# Patient Record
Sex: Female | Born: 1970 | Race: White | Hispanic: No | Marital: Single | Smoking: Never smoker
Health system: Southern US, Community
[De-identification: ages and names within clinical notes are randomized; demographics above are authoritative.]

## PROBLEM LIST (undated history)

## (undated) DIAGNOSIS — R0602 Shortness of breath: Secondary | ICD-10-CM

## (undated) DIAGNOSIS — I10 Essential (primary) hypertension: Secondary | ICD-10-CM

---
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
## 2000-01-21 HISTORY — PX: AUGMENTATION MAMMAPLASTY: SUR837

## 2001-05-28 ENCOUNTER — Other Ambulatory Visit: Admission: RE | Admit: 2001-05-28 | Discharge: 2001-05-28 | Payer: Self-pay | Admitting: Obstetrics and Gynecology

## 2001-11-26 ENCOUNTER — Other Ambulatory Visit: Admission: RE | Admit: 2001-11-26 | Discharge: 2001-11-26 | Payer: Self-pay | Admitting: Obstetrics and Gynecology

## 2002-06-08 ENCOUNTER — Other Ambulatory Visit: Admission: RE | Admit: 2002-06-08 | Discharge: 2002-06-08 | Payer: Self-pay | Admitting: Obstetrics and Gynecology

## 2002-11-09 ENCOUNTER — Other Ambulatory Visit: Admission: RE | Admit: 2002-11-09 | Discharge: 2002-11-09 | Payer: Self-pay | Admitting: Obstetrics and Gynecology

## 2003-01-21 HISTORY — PX: TUBAL LIGATION: SHX77

## 2003-01-26 ENCOUNTER — Inpatient Hospital Stay (HOSPITAL_COMMUNITY): Admission: AD | Admit: 2003-01-26 | Discharge: 2003-01-27 | Payer: Self-pay | Admitting: Obstetrics and Gynecology

## 2003-03-06 ENCOUNTER — Inpatient Hospital Stay (HOSPITAL_COMMUNITY): Admission: AD | Admit: 2003-03-06 | Discharge: 2003-03-06 | Payer: Self-pay | Admitting: Obstetrics and Gynecology

## 2003-03-23 ENCOUNTER — Inpatient Hospital Stay (HOSPITAL_COMMUNITY): Admission: AD | Admit: 2003-03-23 | Discharge: 2003-03-23 | Payer: Self-pay | Admitting: Obstetrics and Gynecology

## 2003-04-21 ENCOUNTER — Inpatient Hospital Stay (HOSPITAL_COMMUNITY): Admission: AD | Admit: 2003-04-21 | Discharge: 2003-04-21 | Payer: Self-pay | Admitting: Obstetrics and Gynecology

## 2003-04-27 ENCOUNTER — Ambulatory Visit (HOSPITAL_COMMUNITY): Admission: RE | Admit: 2003-04-27 | Discharge: 2003-04-27 | Payer: Self-pay | Admitting: Obstetrics and Gynecology

## 2003-04-28 ENCOUNTER — Inpatient Hospital Stay (HOSPITAL_COMMUNITY): Admission: AD | Admit: 2003-04-28 | Discharge: 2003-04-28 | Payer: Self-pay | Admitting: Obstetrics and Gynecology

## 2003-05-03 ENCOUNTER — Inpatient Hospital Stay (HOSPITAL_COMMUNITY): Admission: AD | Admit: 2003-05-03 | Discharge: 2003-05-03 | Payer: Self-pay | Admitting: Obstetrics and Gynecology

## 2003-05-09 ENCOUNTER — Encounter (INDEPENDENT_AMBULATORY_CARE_PROVIDER_SITE_OTHER): Payer: Self-pay | Admitting: Specialist

## 2003-05-09 ENCOUNTER — Inpatient Hospital Stay (HOSPITAL_COMMUNITY): Admission: AD | Admit: 2003-05-09 | Discharge: 2003-05-11 | Payer: Self-pay | Admitting: Obstetrics and Gynecology

## 2003-06-20 ENCOUNTER — Other Ambulatory Visit: Admission: RE | Admit: 2003-06-20 | Discharge: 2003-06-20 | Payer: Self-pay | Admitting: Obstetrics and Gynecology

## 2004-07-03 ENCOUNTER — Other Ambulatory Visit: Admission: RE | Admit: 2004-07-03 | Discharge: 2004-07-03 | Payer: Self-pay | Admitting: Obstetrics and Gynecology

## 2005-01-01 ENCOUNTER — Other Ambulatory Visit: Admission: RE | Admit: 2005-01-01 | Discharge: 2005-01-01 | Payer: Self-pay | Admitting: Obstetrics and Gynecology

## 2005-07-30 ENCOUNTER — Other Ambulatory Visit: Admission: RE | Admit: 2005-07-30 | Discharge: 2005-07-30 | Payer: Self-pay | Admitting: Obstetrics and Gynecology

## 2006-10-15 ENCOUNTER — Ambulatory Visit (HOSPITAL_COMMUNITY): Admission: AD | Admit: 2006-10-15 | Discharge: 2006-10-15 | Payer: Self-pay | Admitting: Obstetrics and Gynecology

## 2010-01-20 HISTORY — PX: CHOLECYSTECTOMY: SHX55

## 2010-06-04 NOTE — Op Note (Signed)
NAMEJAIMI, Donna Vazquez NO.:  [id number]   MEDICAL RECORD NO.:  [id number]          PATIENT TYPE:  AMB   LOCATION:  SDC                           FACILITY:  [facility]   PHYSICIAN:  Zenaida Niece, M.D.DATE OF BIRTH:  [birth year]   DATE OF PROCEDURE:  10/15/2006  DATE OF DISCHARGE:                               OPERATIVE REPORT   PREOPERATIVE DIAGNOSIS:  Postoperative bleeding two days status post  LEEP procedure.   POSTOPERATIVE DIAGNOSIS:  Postoperative bleeding two days status post  LEEP procedure.   PROCEDURE:  Exam under anesthesia and cautery of LEEP bed.   SURGEON:  Zenaida Niece, M.D.   ANESTHESIA:  General with an LMA.   FINDINGS:  Minimal bleeding from the cervix.   SPECIMENS:  None.   ESTIMATED BLOOD LOSS:  In the OR is minimal.   COMPLICATIONS:  None.   PROCEDURE IN DETAIL:  The patient had previously been seen in the office  with a complaint of bleeding since this morning.  In the office, she  passed several large clots and on speculum exam, there was a large clot  in the cervix and there was active bleeding.  The clot was removed and I  attempted to control the bleeding with pressure and with Monsel's  solution.  However, after approximately 10-15 minutes of pressure and  Monsel's, there were two sites on the cervix that would not stop  bleeding.  At that point, the patient was getting uncomfortable and I  decided that we needed to proceed to the operating room.  The cervix and  vagina were then packed with 1/2 inch gauze to pack the cervix until I  could make it to the operating room.  An entire pack was placed and the  speculum was removed.   The patient was brought to the hospital and after appropriate informed  consent was obtained, was taken to the operating room.  She was placed  in the dorsal supine position and general anesthesia was induced.  She  was then placed in mobile stirrups.  The packing was removed and had  minimal blood  on it.  There was no active bleeding noticed externally.  The perineum and vagina were prepped and draped in the usual sterile  fashion and the bladder drained with a latex free catheter.  An  insulated speculum was inserted into the vagina and the cervix was well  exposed.  There was no significant active bleeding at this point.  Small  amounts of excess Monsel's solution were removed.  I did coagulate the  entire LEEP bed with a ball electrode to try and help prevent any  further bleeding.  Again, after several minutes of observation, there  was no significant bleeding from the cervix.  A 2 x 3 inch piece of  Surgicel was placed into the LEEP bed and a 2-0 chromic suture was  placed across the cervix to help secure the Surgicel in place.  The  cervix was again inspected and no significant bleeding was noted.  All  instruments were then  removed from the vagina.  The patient was taken down from stirrups.  She  was extubated and awakened in the operating room and taken to the  recovery room in stable condition after tolerating the procedure well.  Counts were correct.      Zenaida Niece, M.D.  Electronically Signed     TDM/MEDQ  D:  10/15/2006  T:  10/15/2006  Job:  [id number]

## 2010-06-07 NOTE — Discharge Summary (Signed)
NAME:  Donna Vazquez, Donna Vazquez                          ACCOUNT NO.:  [id number]   MEDICAL RECORD NO.:  [id number]                   PATIENT TYPE:  INP   LOCATION:  9119                                 FACILITY:  [facility]   PHYSICIAN:  Zenaida Niece, M.D.             DATE OF BIRTH:  01/23/1970   DATE OF ADMISSION:  05/09/2003  DATE OF DISCHARGE:  05/11/2003                                 DISCHARGE SUMMARY   ADMISSION DIAGNOSES:  1. Intrauterine pregnancy at 37+ weeks.  2. Group B streptococcus carrier.   DISCHARGE DIAGNOSES:  1. Intrauterine pregnancy at 37+ weeks.  2. Group B streptococcus carrier.  3. Desires surgical sterility.   PROCEDURES:  On May 09, 2003 she had a spontaneous vaginal delivery and a  bilateral partial salpingectomy.   HISTORY AND PHYSICAL:  This is a 40 year old white female gravida 6 para 0-1-  4-1 with an EGA of 37+ weeks who presents for elective induction.  The  patient is miserable with pain, pressure, and contractions.  She had an  amniocentesis performed on April 7 for fetal lung maturity which had an  immature FLM and L/S but positive PG.  Prenatal labs:  Blood type is O  negative with a negative antibody screen, RPR nonreactive, rubella immune,  hepatitis B surface antigen negative, HIV negative, gonorrhea and chlamydia  negative, triple screen normal.  One-hour Glucola 127 and repeat was 139 and  that 3-hour GTT was 75, 160, 123, and 103.  Group B strep is positive.  Prenatal care complicated by a history of four spontaneous abortions and one  preterm delivery.  She did have preterm contractions with negative fetal  fibronectins and was treated with terbutaline and Procardia p.r.n.  Headaches in the first and second trimesters difficult to control, and  reflux treated with Zantac and Prevacid.  Post OB history is spontaneous  abortion x4 with D&C's and March 1998 she had preterm premature rupture of  membranes with a vaginal delivery at 36 weeks, 5  pounds 12 ounces.  She had  A2 gestational diabetes and preterm labor with contractions and the baby had  occult spina bifida and had a kidney problem requiring nephrectomy.  GYN  history:  History of low-grade SIL with normal colposcopy.  Past medical  history:  History of mitral valve prolapse without regurgitation.  Past  surgical history:  D&C x4, appendectomy, and breast augmentation.  Medications:  Baby aspirin, Prevacid, prenatal vitamins, and folic acid.  Physical exam:  She is afebrile with stable vital signs.  Fetal heart  tracing reactive with irregular contractions.  Abdomen gravid, nontender,  with an estimated fetal weight of 6-and-a-half pounds.  Vaginal exam was 3-  4, 70, -1 to -2 with a vertex presentation and amniotomy was attempted but I  obtained no fluid.   HOSPITAL COURSE:  The patient was admitted and had the above-mentioned  amniotomy performed for induction.  She  was given penicillin for group B  strep prophylaxis.  She then required Pitocin to enter active labor.  However, once she entered labor she progressed fairly quickly and on the  afternoon of March 19 reached complete, pushed well, and had a vaginal  delivery of a viable female infant with Apgars of 8 and 9 that weighed 6  pounds 13 ounces.  Placenta delivered spontaneous and was intact.  She had a  small second degree laceration repaired with 2-0 Vicryl.  Estimated blood  loss was less than 500 mL.  She wished to proceed with surgical sterility  and had an epidural for delivery.  Thus, on the evening of April 19 she had  a bilateral partial salpingectomy performed with her epidural anesthesia  without complications.  Postpartum she had no complications.  Predelivery  hemoglobin 11.7, post delivery 10.8.  On the morning of postpartum day #2  she was stable for discharge home.  Her incision was healing well.   DISCHARGE INSTRUCTIONS:  Regular diet, pelvic rest, no strenuous activity.  Follow-up is in 6  weeks.  Medications are over-the-counter ibuprofen as  needed, and she is given our discharge pamphlet.                                               Zenaida Niece, M.D.    TDM/MEDQ  D:  05/11/2003  T:  05/11/2003  Job:  [id number]

## 2010-06-07 NOTE — Op Note (Signed)
NAME:  Donna Vazquez, Donna Vazquez                          ACCOUNT NO.:  [id number]   MEDICAL RECORD NO.:  [id number]                   PATIENT TYPE:  INP   LOCATION:  9119                                 FACILITY:  [facility]   PHYSICIAN:  Zenaida Niece, M.D.             DATE OF BIRTH:  Apr 05, 1970   DATE OF PROCEDURE:  05/09/2003  DATE OF DISCHARGE:                                 OPERATIVE REPORT   PREOPERATIVE DIAGNOSES:  Desires surgical sterility and multiparity.   POSTOPERATIVE DIAGNOSES:  Desires surgical sterility and multiparity.   PROCEDURE:  Bilateral partial salpingectomy.   SURGEON:  Zenaida Niece, M.D.   ANESTHESIA:  Epidural.   ESTIMATED BLOOD LOSS:  Less than 50 mL   FINDINGS:  The patient had normal anatomy.   PROCEDURE IN DETAIL:  The patient was taken to the operating room and placed  in the dorsal supine position.  Previously placed epidural was dosed  appropriately, and abdomen was prepped and draped in the usual sterile  fashion.  The level of her anesthesia was found to be adequate, and  infraumbilical skin was infiltrated with 0.25% Marcaine.  A 3 cm horizontal  incision was made and carried down to the fascia.  The fascia was incised  and peritoneum entered bluntly.  Both the fallopian tubes were identified  and traced to their fimbriated ends.  A knuckle of tube on each side was  ligated with 0 plain gut suture.  The knuckle of tube was then removed  sharply.  On both sides, both ostia were identified, and the stumps were  hemostatic.  The fascia was then closed in a running layer with 0 Vicryl.  The skin was then closed with running subcuticular suture of 4-0 Vicryl  followed by a bandage.  The patient tolerated the procedure well and was  taken to the recovery room in stable condition.  Counts were correct x 2,  and she received no antibiotics.                                               Zenaida Niece, M.D.    TDM/MEDQ  D:  05/09/2003  T:   05/10/2003  Job:  [id number]

## 2010-10-31 LAB — CBC
HCT: 40.2
RBC: 4.65
RDW: 12.4
WBC: 8.3

## 2010-10-31 LAB — TYPE AND SCREEN: ABO/RH(D): O NEG

## 2011-06-01 ENCOUNTER — Emergency Department (INDEPENDENT_AMBULATORY_CARE_PROVIDER_SITE_OTHER): Payer: BC Managed Care – PPO

## 2011-06-01 ENCOUNTER — Encounter (HOSPITAL_BASED_OUTPATIENT_CLINIC_OR_DEPARTMENT_OTHER): Payer: Self-pay | Admitting: *Deleted

## 2011-06-01 ENCOUNTER — Emergency Department (HOSPITAL_BASED_OUTPATIENT_CLINIC_OR_DEPARTMENT_OTHER)
Admission: EM | Admit: 2011-06-01 | Discharge: 2011-06-01 | Disposition: A | Discharge status: Home / Self Care | Payer: BC Managed Care – PPO | Attending: Emergency Medicine | Admitting: Emergency Medicine

## 2011-06-01 DIAGNOSIS — S5010XA Contusion of unspecified forearm, initial encounter: Secondary | ICD-10-CM | POA: Insufficient documentation

## 2011-06-01 DIAGNOSIS — S6990XA Unspecified injury of unspecified wrist, hand and finger(s), initial encounter: Secondary | ICD-10-CM

## 2011-06-01 DIAGNOSIS — M79609 Pain in unspecified limb: Secondary | ICD-10-CM

## 2011-06-01 DIAGNOSIS — S59909A Unspecified injury of unspecified elbow, initial encounter: Secondary | ICD-10-CM

## 2011-06-01 DIAGNOSIS — X58XXXA Exposure to other specified factors, initial encounter: Secondary | ICD-10-CM

## 2011-06-01 DIAGNOSIS — W2203XA Walked into furniture, initial encounter: Secondary | ICD-10-CM | POA: Insufficient documentation

## 2011-06-01 NOTE — ED Provider Notes (Signed)
History   This chart was scribed for Donna Quarry, MD by Charolett Bumpers . The patient was seen in room MH05/MH05.    CSN: [id number]  Arrival date & time 06/01/11  2104   First MD Initiated Contact with Patient 06/01/11 2138      Chief Complaint  Patient presents with  . Arm Injury    (Consider location/radiation/quality/duration/timing/severity/associated sxs/prior treatment) HPI Donna Vazquez is a 41 y.o. female who presents to the Emergency Department complaining of constant, moderate right forearm pain after an injury that occurred at 1900 tonight. Patient states she struck the ulnar part of forearm on a granite countertop while pulling it away with associated swelling and bruising noted. Patient notes some tingling in fingers with movement. Patient denies any pertinent medical hx. Patient denies any other symptoms or injuries.   History reviewed. No pertinent past medical history.  History reviewed. No pertinent past surgical history.  History reviewed. No pertinent family history.  History  Substance Use Topics  . Smoking status: Never Smoker   . Smokeless tobacco: Not on file  . Alcohol Use: No    OB History    Grav Para Term Preterm Abortions TAB SAB Ect Mult Living                  Review of Systems  Musculoskeletal:       Right forearm injury.    All other systems reviewed and are negative.    Allergies  Review of patient's allergies indicates not on file.  Home Medications  No current outpatient prescriptions on file.  BP 127/74  Pulse 100  Temp(Src) 98 F (36.7 C) (Oral)  Resp 20  Ht 5\' 3"  (1.6 m)  Wt 130 lb (58.968 kg)  BMI 23.03 kg/m2  SpO2 100%  LMP 05/18/2011  Physical Exam  Nursing note and vitals reviewed. Constitutional: She is oriented to person, place, and time. She appears well-developed and well-nourished. No distress.  HENT:  Head: Normocephalic and atraumatic.  Right Ear: External ear normal.  Left Ear: External  ear normal.  Nose: Nose normal.  Eyes: EOM are normal. Pupils are equal, round, and reactive to light.  Neck: Normal range of motion. Neck supple. No tracheal deviation present.  Cardiovascular: Normal rate.   Pulmonary/Chest: Effort normal. No respiratory distress.  Abdominal: Soft. She exhibits no distension.  Musculoskeletal: Normal range of motion. She exhibits no edema.       Right forearm ecchymosis noted on ulnar side with some minor tenderness noted. Sensation intact. Movement of fingers intact. Movement of elbow intact. Radial pulse normal. Cap refill <3 sec.    Neurological: She is alert and oriented to person, place, and time. No sensory deficit.  Skin: Skin is warm and dry.  Psychiatric: She has a normal mood and affect. Her behavior is normal.    ED Course  Procedures (including critical care time)  DIAGNOSTIC STUDIES: Oxygen Saturation is 100% on room air, normal by my interpretation.    COORDINATION OF CARE:  2145: Discussed planned course of treatment with the patient who is agreeable at this time. Discussed taking Ibuprofen, icing the injury and keeping it elevated.    Labs Reviewed - No data to display Dg Forearm Right  06/01/2011  *RADIOLOGY REPORT*  Clinical Data: Right arm injury and pain.  RIGHT FOREARM - 2 VIEW  Comparison: None.  Findings: No fracture, foreign body, or acute bony findings are identified.  IMPRESSION:  No significant abnormality identified.  Original  Report Authenticated By: Dellia Cloud, M.D.     No diagnosis found.    MDM  Contusion, patient advised ice, elevation and nsaid.   I personally performed the services described in this documentation, which was scribed in my presence. The recorded information has been reviewed and considered.   Donna Quarry, MD 06/01/11 2259

## 2011-06-01 NOTE — Discharge Instructions (Signed)
Bone Bruise  A bone bruise is a small hidden fracture of the bone. It typically occurs with bones located close to the surface of the skin.  SYMPTOMS  The pain lasts longer than a normal bruise.   The bruised area is difficult to use.   There may be discoloration or swelling of the bruised area.   When a bone bruise is found with injury to the anterior cruciate ligament (in the knee) there is often an increased:   Amount of fluid in the knee   Time the fluid in the knee lasts.   Number of days until you are walking normally and regaining the motion you had before the injury.   Number of days with pain from the injury.  DIAGNOSIS  It can only be seen on X-rays known as MRIs. This stands for magnetic resonance imaging. A regular X-Conita Amenta taken of a bone bruise would appear to be normal. A bone bruise is a common injury in the knee and the heel bone (calcaneus). The problems are similar to those produced by stress fractures, which are bone injuries caused by overuse. A bone bruise may also be a sign of other injuries. For example, bone bruises are commonly found where an anterior cruciate ligament (ACL) in the knee has been pulled away from the bone (ruptured). A ligament is a tough fibrous material that connects bones together to make our joints stable. Bruises of the bone last a lot longer than bruises of the muscle or tissues beneath the skin. Bone bruises can last from days to months and are often more severe and painful than other bruises. TREATMENT Because bone bruises are sudden injuries you cannot often prevent them, other than by being extremely careful. Some things you can do to improve the condition are:  Apply ice to the sore area for 15 to 20 minutes, 3 to 4 times per day while awake for the first 2 days. Put the ice in a plastic bag, and place a towel between the bag of ice and your skin.   Keep your bruised area raised (elevated) when possible to lessen swelling.   For activity:     Use crutches when necessary; do not put weight on the injured leg until you are no longer tender.   You may walk on your affected part as the pain allows, or as instructed.   Start weight bearing gradually on the bruised part.   Continue to use crutches or a cane until you can stand without causing pain, or as instructed.   If a plaster splint was applied, wear the splint until you are seen for a follow-up examination. Rest it on nothing harder than a pillow the first 24 hours. Do not put weight on it. Do not get it wet. You may take it off to take a shower or bath.   If an air splint was applied, more air may be blown into or out of the splint as needed for comfort. You may take it off at night and to take a shower or bath.   Wiggle your toes in the splint several times per day if you are able.   You may have been given an elastic bandage to use with the plaster splint or alone. The splint is too tight if you have numbness, tingling or if your foot becomes cold and blue. Adjust the bandage to make it comfortable.   Only take over-the-counter or prescription medicines for pain, discomfort, or fever as directed by   discomfort, or fever as directed by your caregiver.   Follow all instructions for follow up with your caregiver. This includes any orthopedic referrals, physical therapy, and rehabilitation. Any delay in obtaining necessary care could result in a delay or failure of the bones to heal.  SEEK MEDICAL CARE IF:    You have an increase in bruising, swelling, or pain.   You notice coldness of your toes.   You do not get pain relief with medications.  SEEK IMMEDIATE MEDICAL CARE IF:    Your toes are numb or blue.   You have severe pain not controlled with medications.   If any of the problems that caused you to seek care are becoming worse.  Document Released: 03/29/2003 Document Revised: 12/26/2010 Document Reviewed: 08/11/2007  [employer] Patient Information 2012 [employer]
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
## 2012-03-29 ENCOUNTER — Encounter (HOSPITAL_BASED_OUTPATIENT_CLINIC_OR_DEPARTMENT_OTHER): Payer: Self-pay | Admitting: Family Medicine

## 2012-03-29 ENCOUNTER — Emergency Department (HOSPITAL_BASED_OUTPATIENT_CLINIC_OR_DEPARTMENT_OTHER): Payer: BC Managed Care – PPO

## 2012-03-29 ENCOUNTER — Emergency Department (HOSPITAL_BASED_OUTPATIENT_CLINIC_OR_DEPARTMENT_OTHER)
Admission: EM | Admit: 2012-03-29 | Discharge: 2012-03-29 | Disposition: A | Discharge status: Home / Self Care | Payer: BC Managed Care – PPO | Attending: Emergency Medicine | Admitting: Emergency Medicine

## 2012-03-29 DIAGNOSIS — S93401A Sprain of unspecified ligament of right ankle, initial encounter: Secondary | ICD-10-CM

## 2012-03-29 DIAGNOSIS — Y929 Unspecified place or not applicable: Secondary | ICD-10-CM | POA: Insufficient documentation

## 2012-03-29 DIAGNOSIS — W108XXA Fall (on) (from) other stairs and steps, initial encounter: Secondary | ICD-10-CM | POA: Insufficient documentation

## 2012-03-29 DIAGNOSIS — X500XXA Overexertion from strenuous movement or load, initial encounter: Secondary | ICD-10-CM | POA: Insufficient documentation

## 2012-03-29 DIAGNOSIS — S93409A Sprain of unspecified ligament of unspecified ankle, initial encounter: Secondary | ICD-10-CM | POA: Insufficient documentation

## 2012-03-29 DIAGNOSIS — Y939 Activity, unspecified: Secondary | ICD-10-CM | POA: Insufficient documentation

## 2012-03-29 MED ORDER — HYDROCODONE-ACETAMINOPHEN 5-325 MG PO TABS: 1.0000 | ORAL_TABLET | Freq: Four times a day (QID) | ORAL | Status: DC | PRN

## 2012-03-29 MED ORDER — IBUPROFEN 400 MG PO TABS
600.0000 mg | ORAL_TABLET | Freq: Once | ORAL | Status: AC
  Administered 2012-03-29: 600 mg via ORAL
  Filled 2012-03-29: qty 1

## 2012-03-29 MED ORDER — HYDROCODONE-ACETAMINOPHEN 5-325 MG PO TABS
1.0000 | ORAL_TABLET | Freq: Once | ORAL | Status: AC
  Administered 2012-03-29: 1 via ORAL
  Filled 2012-03-29: qty 1

## 2012-03-29 NOTE — ED Notes (Signed)
Pt states she is going to see her own orthopedic md this week, requests copies of xrays, radiology notified, will bring disk to room when complete.

## 2012-03-29 NOTE — ED Provider Notes (Signed)
History     CSN: [id number]  Arrival date & time 03/29/12  4098   First MD Initiated Contact with Patient 03/29/12 [phone]      Chief Complaint  Patient presents with  . Ankle Pain    (Consider location/radiation/quality/duration/timing/severity/associated sxs/prior treatment) Patient is a 42 y.o. female presenting with ankle pain. The history is provided by the patient.  Ankle Pain Associated symptoms: no fever   pt indicates twisted right ankle on step this morning. Constant, dull, mod-sev pain laterally at ankle. Skin intact. Too painful to walk on. No knee pain. Denies other injury. No numbness/weakness.    History reviewed. No pertinent past medical history.  Past Surgical History  Procedure Laterality Date  . Tubal ligation    . Cholecystectomy      No family history on file.  History  Substance Use Topics  . Smoking status: Never Smoker   . Smokeless tobacco: Not on file  . Alcohol Use: No    OB History   Grav Para Term Preterm Abortions TAB SAB Ect Mult Living                  Review of Systems  Constitutional: Negative for fever.  Skin: Negative for wound.  Neurological: Negative for numbness.    Allergies  Review of patient's allergies indicates no known allergies.  Home Medications   Current Outpatient Rx  Name  Route  Sig  Dispense  Refill  . acetaminophen (TYLENOL) 325 MG tablet   Oral   Take by mouth every 6 (six) hours as needed. Patient used this medication for her headache.           BP 124/70  Pulse 82  Temp(Src) 97.9 F (36.6 C) (Oral)  Resp 20  SpO2 100%  LMP 03/22/2012  Physical Exam  Nursing note and vitals reviewed. Constitutional: She appears well-developed and well-nourished. No distress.  HENT:  Head: Atraumatic.  Eyes: Conjunctivae are normal. No scleral icterus.  Neck: Neck supple. No tracheal deviation present.  Cardiovascular: Normal rate.   Pulmonary/Chest: Effort normal. No respiratory distress.   Abdominal: Normal appearance.  Musculoskeletal:  sts and tenderness lateral malleolus right ankle. Ankle grossly stable. Dp/pt palp. Normal cap refill distally. No 5th mt tenderness. No prox tib fib or knee tenderness.   Neurological: She is alert.  r foot nvi.   Skin: Skin is warm and dry. No rash noted.  Psychiatric: She has a normal mood and affect.    ED Course  Procedures (including critical care time)   Dg Ankle Complete Right  03/29/2012  *RADIOLOGY REPORT*  Clinical Data: Injury  RIGHT ANKLE - COMPLETE 3+ VIEW  Comparison: None.  Findings: Mild soft tissue swelling over the lateral malleolus. Minimal spurring at the inferior calcaneus.  No acute fracture and no dislocation.  IMPRESSION: No acute bony pathology.   Original Report Authenticated By: Jolaine Click, M.D.       MDM  No meds pta. vicodin po, motrin po. Xray.  pts has ride/spouse here.  Ice. Crutches.  Reviewed nursing notes and prior charts for additional history.   Discussed xray results w pt.   Hx/exam c/w sprain.         Suzi Roots, MD 03/29/12 [phone]

## 2012-03-29 NOTE — ED Notes (Signed)
Pt states that she is now having more pain, requests pain medication. Dr. Denton Lank notified.

## 2012-03-29 NOTE — ED Notes (Signed)
Pt c/o right ankle pain since falling on step this morning. CMS intact.

## 2012-12-01 ENCOUNTER — Observation Stay (HOSPITAL_COMMUNITY)
Admission: AD | Admit: 2012-12-01 | Discharge: 2012-12-02 | Disposition: A | Discharge status: Home / Self Care | Payer: Commercial Managed Care - PPO | Source: Other Acute Inpatient Hospital | Attending: Internal Medicine | Admitting: Internal Medicine

## 2012-12-01 ENCOUNTER — Encounter (HOSPITAL_COMMUNITY): Payer: Self-pay | Admitting: General Practice

## 2012-12-01 ENCOUNTER — Observation Stay (HOSPITAL_COMMUNITY): Payer: Commercial Managed Care - PPO

## 2012-12-01 DIAGNOSIS — R0609 Other forms of dyspnea: Secondary | ICD-10-CM

## 2012-12-01 DIAGNOSIS — F411 Generalized anxiety disorder: Secondary | ICD-10-CM | POA: Insufficient documentation

## 2012-12-01 DIAGNOSIS — F419 Anxiety disorder, unspecified: Secondary | ICD-10-CM

## 2012-12-01 DIAGNOSIS — I10 Essential (primary) hypertension: Secondary | ICD-10-CM | POA: Insufficient documentation

## 2012-12-01 DIAGNOSIS — R064 Hyperventilation: Principal | ICD-10-CM | POA: Insufficient documentation

## 2012-12-01 DIAGNOSIS — R0600 Dyspnea, unspecified: Secondary | ICD-10-CM

## 2012-12-01 HISTORY — DX: Shortness of breath: R06.02

## 2012-12-01 HISTORY — DX: Essential (primary) hypertension: I10

## 2012-12-01 LAB — BASIC METABOLIC PANEL
BUN: 14 mg/dL (ref 6–23)
CO2: 19 mEq/L (ref 19–32)
Calcium: 9.2 mg/dL (ref 8.4–10.5)
Creatinine, Ser: 0.84 mg/dL (ref 0.50–1.10)
GFR calc Af Amer: >90 mL/min (ref >90)
GFR calc non Af Amer: 83 mL/min — ABNORMAL LOW (ref >90)
Glucose, Bld: 124 mg/dL — ABNORMAL HIGH (ref 70–99)
Sodium: 138 mEq/L (ref 135–145)

## 2012-12-01 LAB — CBC
MCV: 84.1 fL (ref 78.0–100.0)
Platelets: 232 10*3/uL (ref 150–400)
RDW: 13.2 % (ref 11.5–15.5)

## 2012-12-01 MED ORDER — ENOXAPARIN SODIUM 40 MG/0.4ML ~~LOC~~ SOLN
40.0000 mg | SUBCUTANEOUS | Status: DC
  Administered 2012-12-01: 40 mg via SUBCUTANEOUS
  Filled 2012-12-01 (×2): qty 0.4

## 2012-12-01 MED ORDER — CLONAZEPAM 0.5 MG PO TABS
0.5000 mg | ORAL_TABLET | Freq: Three times a day (TID) | ORAL | Status: DC
  Administered 2012-12-01 – 2012-12-02 (×4): 0.5 mg via ORAL
  Filled 2012-12-01 (×4): qty 1
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
## 2012-12-02 ENCOUNTER — Observation Stay (HOSPITAL_COMMUNITY): Payer: Commercial Managed Care - PPO

## 2012-12-02 DIAGNOSIS — R0609 Other forms of dyspnea: Secondary | ICD-10-CM

## 2012-12-02 DIAGNOSIS — R0989 Other specified symptoms and signs involving the circulatory and respiratory systems: Secondary | ICD-10-CM

## 2012-12-02 LAB — BLOOD GAS, ARTERIAL
Acid-base deficit: 5.8 mmol/L — ABNORMAL HIGH (ref 0.0–2.0)
Bicarbonate: 17.5 mEq/L — ABNORMAL LOW (ref 20.0–24.0)
O2 Saturation: 99.8 %
Patient temperature: 97.6
TCO2: 18.3 mmol/L (ref 0–100)

## 2012-12-02 LAB — PULMONARY FUNCTION TEST
DL/VA % pred: 119 %
DLCO unc: 28.33 ml/min/mmHg
FEF 25-75 Post: 3.53 L/sec
FEF 25-75 Pre: 2.06 L/sec
FEV1-%Change-Post: 16 %
FEV6-%Pred-Post: 114 %
FEV6-Post: 3.96 L
FEV6FVC-%Pred-Post: 102 %
FEV6FVC-%Pred-Pre: 102 %
FVC-%Pred-Post: 112 %
Post FEV1/FVC ratio: 80 %
RV % pred: 104 %
RV: 1.69 L

## 2012-12-02 LAB — BASIC METABOLIC PANEL
BUN: 17 mg/dL (ref 6–23)
Calcium: 9.3 mg/dL (ref 8.4–10.5)
Creatinine, Ser: 0.97 mg/dL (ref 0.50–1.10)
GFR calc Af Amer: 81 mL/min — ABNORMAL LOW (ref >90)
GFR calc non Af Amer: 70 mL/min — ABNORMAL LOW (ref >90)
Glucose, Bld: 103 mg/dL — ABNORMAL HIGH (ref 70–99)
Potassium: 3.9 mEq/L (ref 3.5–5.1)
Sodium: 136 mEq/L (ref 135–145)

## 2012-12-02 MED ORDER — POTASSIUM CHLORIDE CRYS ER 20 MEQ PO TBCR
40.0000 meq | EXTENDED_RELEASE_TABLET | Freq: Once | ORAL | Status: AC
  Administered 2012-12-02: 40 meq via ORAL
  Filled 2012-12-02: qty 2

## 2012-12-02 MED ORDER — ALBUTEROL SULFATE (5 MG/ML) 0.5% IN NEBU
2.5000 mg | INHALATION_SOLUTION | Freq: Once | RESPIRATORY_TRACT | Status: AC
  Administered 2012-12-02: 2.5 mg via RESPIRATORY_TRACT

## 2012-12-02 MED ORDER — CLONAZEPAM 0.5 MG PO TABS: 0.5000 mg | ORAL_TABLET | Freq: Three times a day (TID) | ORAL | Status: DC

## 2012-12-02 NOTE — Progress Notes (Signed)
*  PRELIMINARY RESULTS* Echocardiogram 2D Echocardiogram has been performed.  Jeryl Columbia 12/02/2012, 2:12 PM

## 2012-12-02 NOTE — Progress Notes (Signed)
Advanced Heart Failure TeamNote    HPI:    Donna Vazquez is a 42 yo female with no prior cardiac history who was recently admitted to the [facility] for increased SOB. She is a friend of our [facility] nurse from [facility] and asked to be transferred to [facility].   Doing much better on klonopin. Breathing easier.  ' Echo and PFTs reviewed personally and are normal.   FEV1 3.7 L (104% FVC 2.7 (94%) Ratio 81%  DLCO 123%  Echo EF 60% normal RV. Normal diastolic parameters. BP now low  Objective:    Vital Signs:   Temp:  [97.6 F (36.4 C)-97.8 F (36.6 C)] 97.6 F (36.4 C) (11/13 0521) Pulse Rate:  [80-96] 80 (11/13 0521) Resp:  [20] 20 (11/13 0521) BP: (98-107)/(50-69) 98/50 mmHg (11/13 0521) SpO2:  [100 %] 100 % (11/13 0521) Weight:  [66.724 kg (147 lb 1.6 oz)] 66.724 kg (147 lb 1.6 oz) (11/13 0521) Last BM Date: 11/30/12  Weight change: Filed Weights   12/02/12 0521  Weight: 66.724 kg (147 lb 1.6 oz)    Intake/Output:   Intake/Output Summary (Last 24 hours) at 12/02/12 1728 Last data filed at 12/02/12 1000  Gross per 24 hour  Intake    240 ml  Output    400 ml  Net   -160 ml     Physical Exam: General: Relaxed comfortable HEENT: normal Neck: supple. JVP flat. Carotids 2+ bilat; no bruits. No lymphadenopathy or thryomegaly appreciated. Cor: PMI nondisplaced. Regular rate & rhythm. No rubs, gallops or murmurs. Lungs: clear;  Abdomen: soft, nontender, nondistended. No hepatosplenomegaly. No bruits or masses. Good bowel sounds. Extremities: no cyanosis, clubbing, rash, edema Neuro: alert & orientedx3, cranial nerves grossly intact. moves all 4 extremities w/o difficulty. Affect pleasant  Telemetry: SR  Labs: Basic Metabolic Panel:  Recent Labs Lab 12/01/12 1852 12/02/12 0506  NA 138 136  K 3.5 3.9  CL 105 105  CO2 19 18*  GLUCOSE 124* 103*  BUN 14 17  CREATININE 0.84 0.97  CALCIUM 9.2 9.3    Liver Function Tests: No results found for this  basename: AST, ALT, ALKPHOS, BILITOT, PROT, ALBUMIN,  in the last 168 hours No results found for this basename: LIPASE, AMYLASE,  in the last 168 hours No results found for this basename: AMMONIA,  in the last 168 hours  CBC:  Recent Labs Lab 12/01/12 1852  WBC 9.6  HGB 14.6  HCT 42.7  MCV 84.1  PLT 232    Cardiac Enzymes: No results found for this basename: CKTOTAL, CKMB, CKMBINDEX, TROPONINI,  in the last 168 hours  BNP: BNP (last 3 results) No results found for this basename: PROBNP,  in the last 8760 hours  CBG: No results found for this basename: GLUCAP,  in the last 168 hours  Coagulation Studies: No results found for this basename: LABPROT, INR,  in the last 72 hours   Imaging: Dg Chest 2 View  12/02/2012   CLINICAL DATA:  Shortness of breath.  EXAM: CHEST  2 VIEW  COMPARISON:  None.  FINDINGS: The lungs are well-aerated and clear. There is no evidence of focal opacification, pleural effusion or pneumothorax.  The heart is normal in size; the mediastinal contour is within normal limits. No acute osseous abnormalities are seen. Clips are noted within the right upper quadrant, reflecting prior cholecystectomy.  IMPRESSION: No acute cardiopulmonary process seen.   Electronically Signed   By: Roanna Raider M.D.   On: 12/02/2012  02:02     Medications:     Current Medications:    Infusions:     Assessment:   1) Dyspnea      --normal echo at [facility] with mild LVH      --Chest CT at [facility] - no PE     --pBNP 300-400     --ABG with resp alkalosis 2) HTN 3) Anxiety     --recent separation from her husband in 9/14 4) Respiratory alkalosis suggestive of hyperventilation    Plan/Discussion:    Suspect main issue behind HTN/elevated diastolic filling pressures and hyperventialation due to situational stress  Echo and PFTs are normal. Symptoms and Bp much improved on Klonopin will continue for now. Can consider transition to SSRI as needed as an outpatient with her  PCP.  Will f/u with me in several weeks. Call if having recurrent problems.  Would hold BP meds if SBP < 140.  Donna Vazquez 5:28 PM

## 2012-12-02 NOTE — Progress Notes (Signed)
UR COMPLETED  

## 2012-12-02 NOTE — Discharge Summary (Signed)
CARDIOLOGY DISCHARGE SUMMARY   Patient ID: Donna Vazquez MRN: [id number] DOB/AGE: [birth year] 42 y.o.  Admit date: 12/01/2012 Discharge date: 12/02/2012  Primary Discharge Diagnosis:     Dyspnea Secondary Discharge Diagnosis:    Anxiety   Hyperventilation  Procedures:  Pulmonary function testing, two-view chest x-ray, 2-D echocardiogram  Hospital Course: Donna Vazquez is a 42 y.o. female with no history of CAD. She went to [facility] for increasing shortness of breath. She was transferred to [facility] and admitted for further evaluation and treatment.  She had recently been evaluated by her PCP for edema, and started on Maxzide. Her edema worsened and she was started on HCTZ. She went back again and was started on Aldactone. When she continued to have problems with shortness of breath, she was admitted to [facility]. A CT angiogram was performed there which showed no PE. Her ABG showed a ph 7.54, pCO2 21 PO2 142 and bicarb 18.  On 12/01/2012, she was transferred to [facility] for further evaluation and treatment.  She was evaluated by Dr. Gala Romney. She was markedly dyspneic and tachypneic, worse when lying down. Dr. Gala Romney reviewed the ABG and felt it was suggestive of hyperventilation. There was concern for a stress-induced situation.  Her ABG was repeated, again showing a respiratory alkalosis, partially compensated. A 2-D echocardiogram was performed. Her systolic function was normal, EF 55-60%. There were no regional wall motion abnormalities and diastolic function parameters were normal. There were no significant valvular abnormalities. Right ventricular systolic function was normal. She had pulmonary function testing performed and the results were normal.  On 12/02/2012, she was seen by Dr. Gala Romney. She had been started on Klonopin and her symptoms were much improved. He recommended continuing the Klonopin for now, and she is to followup with her  primary care physician for further evaluation and possible transition to SSRI. He did not feel any other inpatient workup was indicated. He felt Donna Vazquez was stable for discharge, to follow up as an outpatient with primary care and with cardiology.  Labs:   ABG    Component Value Date/Time   PHART 7.457* 12/02/2012 0732   PCO2ART 25.0* 12/02/2012 0732   PO2ART 160.0* 12/02/2012 0732   HCO3 17.5* 12/02/2012 0732   TCO2 18.3 12/02/2012 0732   ACIDBASEDEF 5.8* 12/02/2012 0732   O2SAT 99.8 12/02/2012 0732   Lab Results  Component Value Date   WBC 9.6 12/01/2012   HGB 14.6 12/01/2012   HCT 42.7 12/01/2012   MCV 84.1 12/01/2012   PLT 232 12/01/2012     Recent Labs Lab 12/02/12 0506  NA 136  K 3.9  CL 105  CO2 18*  BUN 17  CREATININE 0.97  CALCIUM 9.3  GLUCOSE 103*      Radiology: Dg Chest 2 View 12/02/2012   CLINICAL DATA:  Shortness of breath.  EXAM: CHEST  2 VIEW  COMPARISON:  None.  FINDINGS: The lungs are well-aerated and clear. There is no evidence of focal opacification, pleural effusion or pneumothorax.  The heart is normal in size; the mediastinal contour is within normal limits. No acute osseous abnormalities are seen. Clips are noted within the right upper quadrant, reflecting prior cholecystectomy.  IMPRESSION: No acute cardiopulmonary process seen.   Electronically Signed   By: Roanna Raider M.D.   On: 12/02/2012 02:02   FOLLOW UP PLANS AND APPOINTMENTS No Known Allergies   Medication List    STOP taking these medications  LINZESS 145 MCG Caps capsule  Generic drug:  Linaclotide      TAKE these medications       clonazePAM 0.5 MG tablet  Commonly known as:  KLONOPIN  Take 1 tablet (0.5 mg total) by mouth 3 (three) times daily.     Melatonin 3 MG Tabs  Take 3 mg by mouth at bedtime.        Discharge Orders   Future Orders Complete By Expires   Diet - low sodium heart healthy  As directed    Increase activity slowly  As directed       Follow-up Information   Follow up with Arvilla Meres, MD. (The office will call.)    Specialty:  Cardiology   Contact information:   [address] [place] [address] [phone]       Schedule an appointment as soon as possible for a visit with Tarri Fuller, MD.   Specialty:  Family Medicine   Contact information:   [address] [place] [address] [phone]       BRING ALL MEDICATIONS WITH YOU TO FOLLOW UP APPOINTMENTS  Time spent with patient to include physician time: 32 min Signed: Theodore Demark, PA-C 12/02/2012, 7:06 PM Co-Sign MD

## 2012-12-03 ENCOUNTER — Telehealth (HOSPITAL_COMMUNITY): Payer: Self-pay | Admitting: Cardiology

## 2012-12-03 NOTE — Telephone Encounter (Signed)
Message copied by JEFFRIES, Milagros Reap on Fri Dec 03, 2012  4:20 PM ------      Message from: Darrol Jump      Created: Thu Dec 02, 2012  7:06 PM       Chantel,      Jesusita Oka may have told you about her already. If not, she needs to see him in 2-3 weeks. She came in with shortness of breath and her symptoms were possibly secondary to stress plus/minus hyperventilation.            Thank you,      Bjorn Loser ------

## 2012-12-03 NOTE — Telephone Encounter (Signed)
Attempting to schedule with CHF clinic   Done 12/24/12

## 2012-12-04 NOTE — Discharge Summary (Signed)
Patient seen and examined with Theodore Demark, PA-C. We discussed all aspects of the encounter. I agree with the assessment and plan as stated above. She is ready for d/c. Much improved with Klonopin. See my rounding note for other details.   Daniel Bensimhon,MD 2:35 PM

## 2012-12-06 ENCOUNTER — Encounter (HOSPITAL_COMMUNITY): Payer: Self-pay | Admitting: Cardiology

## 2012-12-10 ENCOUNTER — Telehealth (HOSPITAL_COMMUNITY): Payer: Self-pay | Admitting: Cardiology

## 2012-12-10 NOTE — Telephone Encounter (Signed)
Called patient back. Instructed that not too concerned about weight gain. Reviewing her chart and her ECHO was normal. Told her I could send a PRN lasix order in for her, however she said she would just wait and see how she feels Monday.  Ulla Potash B NP-C 12:50 PM

## 2012-12-10 NOTE — Telephone Encounter (Signed)
Pt called to report 4 lb weight gain overnight.  Increased SOB Pt does not feel well at all  Please advise

## 2012-12-24 ENCOUNTER — Ambulatory Visit (HOSPITAL_COMMUNITY)
Admission: RE | Admit: 2012-12-24 | Discharge: 2012-12-24 | Disposition: A | Discharge status: Home / Self Care | Payer: Commercial Managed Care - PPO | Source: Ambulatory Visit | Attending: Internal Medicine | Admitting: Internal Medicine

## 2012-12-24 ENCOUNTER — Encounter (HOSPITAL_COMMUNITY): Payer: Self-pay

## 2012-12-24 VITALS — BP 120/80 | HR 81 | Ht 63.0 in | Wt 149.1 lb

## 2012-12-24 DIAGNOSIS — F419 Anxiety disorder, unspecified: Secondary | ICD-10-CM

## 2012-12-24 DIAGNOSIS — R0609 Other forms of dyspnea: Secondary | ICD-10-CM | POA: Insufficient documentation

## 2012-12-24 DIAGNOSIS — R0989 Other specified symptoms and signs involving the circulatory and respiratory systems: Secondary | ICD-10-CM | POA: Insufficient documentation

## 2012-12-24 DIAGNOSIS — F411 Generalized anxiety disorder: Secondary | ICD-10-CM | POA: Insufficient documentation

## 2012-12-24 DIAGNOSIS — R0600 Dyspnea, unspecified: Secondary | ICD-10-CM

## 2012-12-24 LAB — BASIC METABOLIC PANEL
BUN: 7 mg/dL (ref 6–23)
CO2: 23 mEq/L (ref 19–32)
Calcium: 8.8 mg/dL (ref 8.4–10.5)
Chloride: 104 mEq/L (ref 96–112)
Creatinine, Ser: 0.77 mg/dL (ref 0.50–1.10)
GFR calc Af Amer: >90 mL/min (ref >90)
Glucose, Bld: 84 mg/dL (ref 70–99)

## 2012-12-24 NOTE — Patient Instructions (Signed)
Doing great.   Will call next week after talking to Dr. Gala Romney, about a cardiopulmonary exercise test.  Have a wonderful Holiday Season.  F/U PRN.

## 2012-12-24 NOTE — Progress Notes (Signed)
Patient ID: Donna Vazquez, female   DOB: 1970-04-15, 41 y.o.   MRN: [id number]  PCP: Dr. Martin Majestic ([facility])  HPI: Donna Vazquez is a 42 y.o. female with no history of CAD. The only medical history she has is HTN and anxiety.   In August of this year she noticed weight gain unexpectedly and went to PCP and was diagnosed with HTN and started on maxide. She continued to gain weight and started noticing edema and went back to her PCP and was started on HCTZ. After starting HCTZ reports she had 3+ pitting edema and SOB and went to [facility]. She was then transferred to [facility] because the patient wanted a second opinion about ?pHTN. During her stay at [facility] CT angio report showed no PE and ECHO report was EF 61% with trace TR. Her pro-BNP on admission was 304 and she was given 40 mg PO lasix with good UOP. Weight on admission 155 lbs. ABG ph 7.54, pCO2 21 PO2 142 and bicarb 18. She recently split from her husband and there was concern for a stress induced situation causing her SOB. She was treated with klonopin and symptoms improved.  ECHO 12/02/12: EF 55-60% PFTs 12/02/12: Mild airflow limitations, lung volumes hyperventilated, nl DLCO  Follow up: Doing pretty good since being home. Reports weight up and down. Exercising again, however reports having dry cough and sometimes getting SOB. Very concerned about weight increasing over the past 2 months (about 20 lbs). Still having some SOB with exercise. Reports some minimal CP or pressure every once in awhile, not associated with any activity. Denies palpitations. Reports some mornings LLE 2+ edema. Weight fluctuates between 142-150 lbs.    ROS: All systems negative except as listed in HPI, PMH and Problem List.  Past Medical History  Diagnosis Date  . Hypertension     "just in the last month; not on RX right now" (12/01/2012)  . Shortness of breath     "all the time for the last 4 days" (12/01/2012)    Current Outpatient  Prescriptions  Medication Sig Dispense Refill  . clonazePAM (KLONOPIN) 0.5 MG tablet Take 1 tablet (0.5 mg total) by mouth 3 (three) times daily.  90 tablet  0  . Melatonin 3 MG TABS Take 3 mg by mouth at bedtime.      Marland Kitchen spironolactone (ALDACTONE) 25 MG tablet Take 25 mg by mouth 2 (two) times daily.       No current facility-administered medications for this encounter.    Filed Vitals:   12/24/12 0900  BP: 120/80  Pulse: 81  Height: 5\' 3"  (1.6 m)  Weight: 149 lb 1.9 oz (67.64 kg)  SpO2: 100%    PHYSICAL EXAM: General:  Well appearing. No resp difficulty HEENT: normal Neck: supple. JVP flat. Carotids 2+ bilaterally; no bruits. No lymphadenopathy or thryomegaly appreciated. Cor: PMI normal. Regular rate & rhythm. No rubs, gallops or murmurs. Lungs: clear Abdomen: soft, nontender, nondistended. No hepatosplenomegaly. No bruits or masses. Good bowel sounds. Extremities: no cyanosis, clubbing, rash, edema Neuro: alert & orientedx3, cranial nerves grossly intact. Moves all 4 extremities w/o difficulty. Affect pleasant.  ASSESSMENT & PLAN:  1) Dyspnea - Much improved since discharge from the hospital. She still feels SOB with exercise occasionally which is new for her and is very concerned about this change. Her ECHO 11/2012 showed nl EF 55-60%, PFTs were non-remarkable and CT angio showed no PE. Very concerned that weight is up 20 lbs in 2-3  months and is exercising daily. She reports she does have 2+ edema occasionally in the mornings. Will order pro-BNP today and BMET. With continued dyspnea will order CPX.  2) Anxiety - Much improved on klonopin PRN. Feels as if she is tolerating divorce better. Continue follow up with PCP.  F/U PRN.  Ulla Potash B NP-C 10:36 AM

## 2012-12-29 ENCOUNTER — Ambulatory Visit (HOSPITAL_COMMUNITY): Payer: Commercial Managed Care - PPO | Attending: Internal Medicine

## 2012-12-29 DIAGNOSIS — R0989 Other specified symptoms and signs involving the circulatory and respiratory systems: Secondary | ICD-10-CM | POA: Insufficient documentation

## 2012-12-29 DIAGNOSIS — R0609 Other forms of dyspnea: Secondary | ICD-10-CM | POA: Insufficient documentation

## 2012-12-29 DIAGNOSIS — R0600 Dyspnea, unspecified: Secondary | ICD-10-CM

## 2012-12-31 DIAGNOSIS — R0989 Other specified symptoms and signs involving the circulatory and respiratory systems: Secondary | ICD-10-CM

## 2012-12-31 DIAGNOSIS — R0609 Other forms of dyspnea: Secondary | ICD-10-CM

## 2013-03-01 ENCOUNTER — Telehealth (HOSPITAL_COMMUNITY): Payer: Self-pay | Admitting: Anesthesiology

## 2013-03-01 NOTE — Telephone Encounter (Signed)
Called to review CPX test with her. Exercise testing with gas exchange demonstrates an excellent functional capacity when compared to matched sedentary norms. Reports she is doing well and no longer complaining of SOB. Exercising and weight stable. Follow up PRN.  Ulla Potashosgrove, Camara Rosander B NP-C 4:33 PM

## 2014-05-20 IMAGING — CR DG CHEST 2V
2 series · 2 of 2 positions shown · non-contrast
Comparison: None.

CLINICAL DATA: Shortness of breath.

EXAM:
CHEST  2 VIEW

[w chest pa]
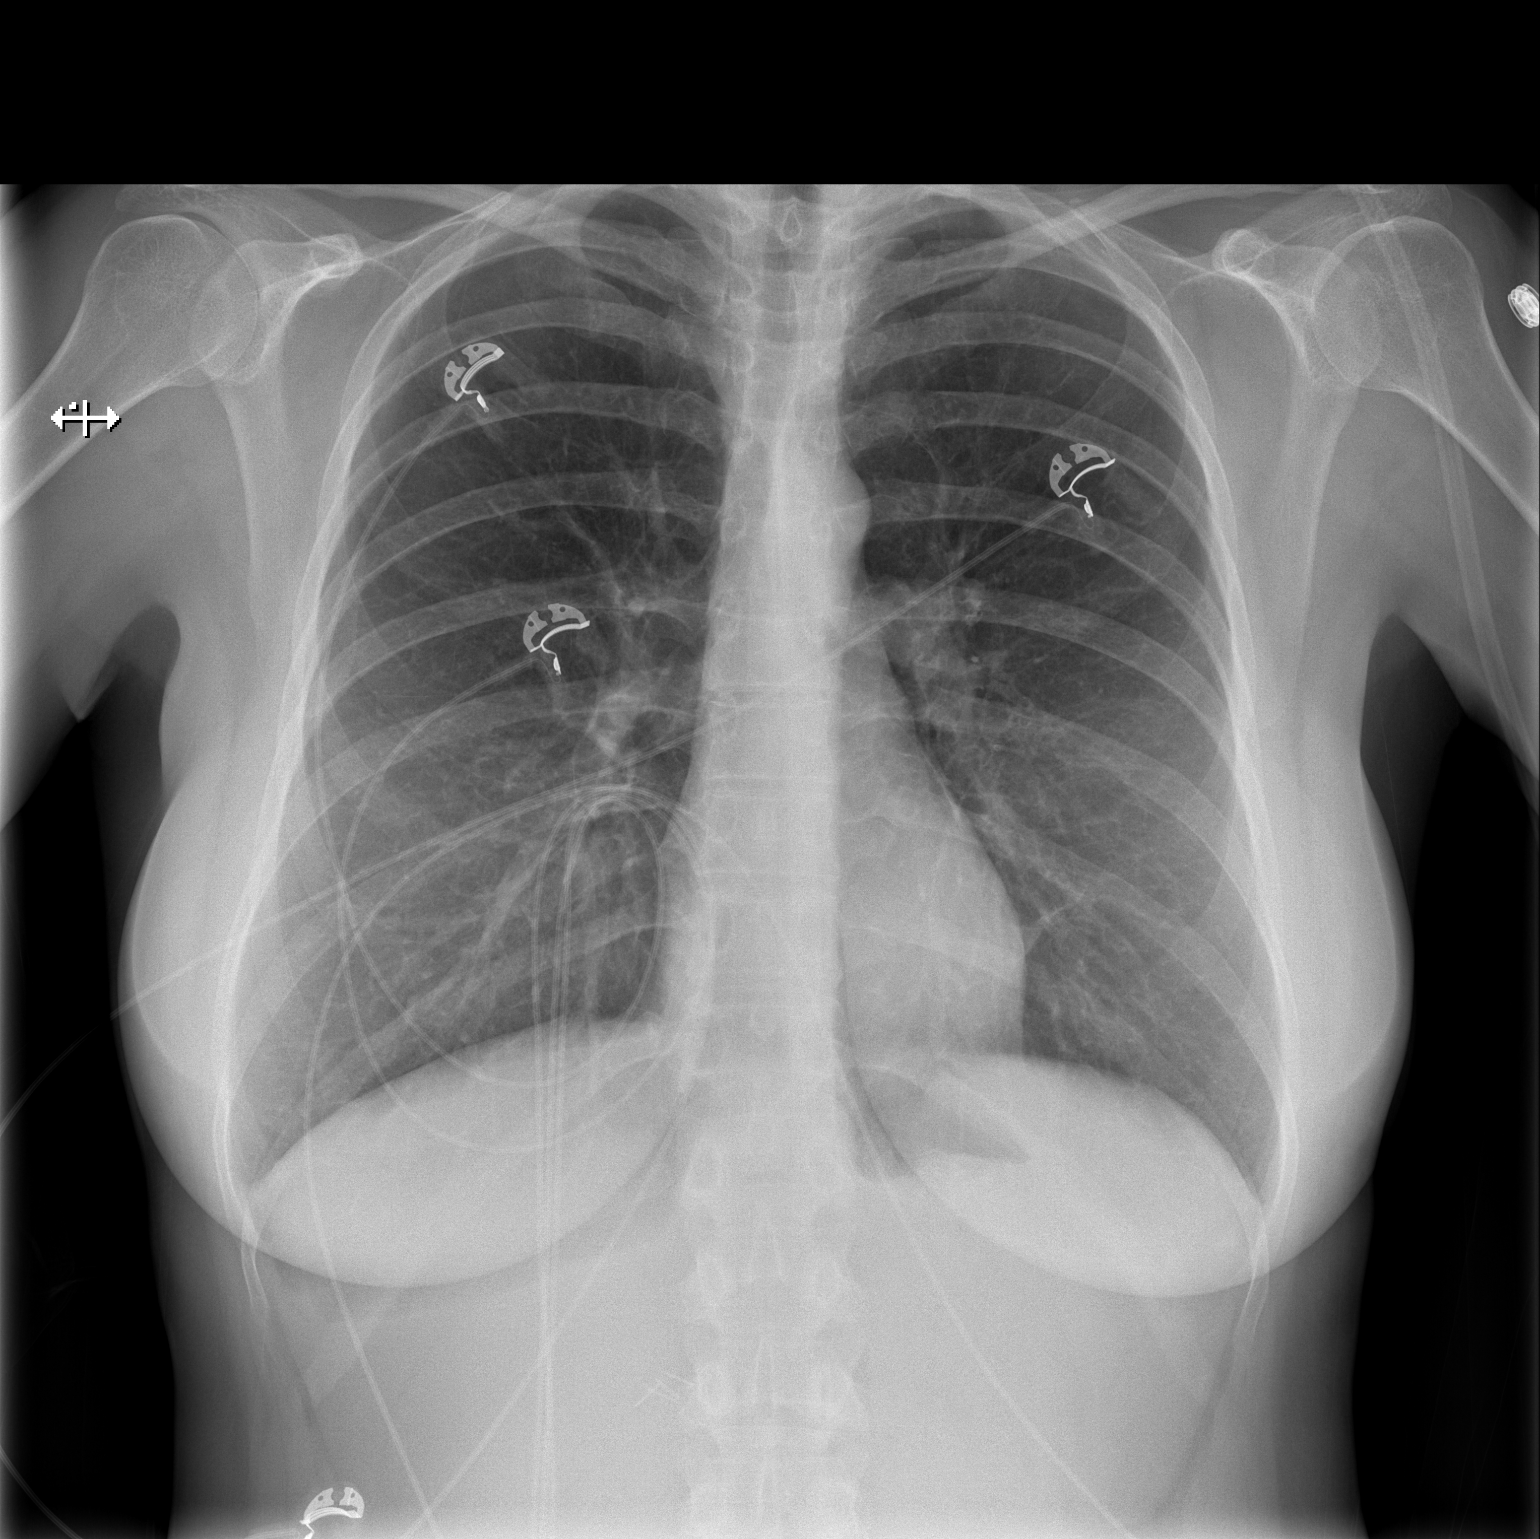

[w chest lat]
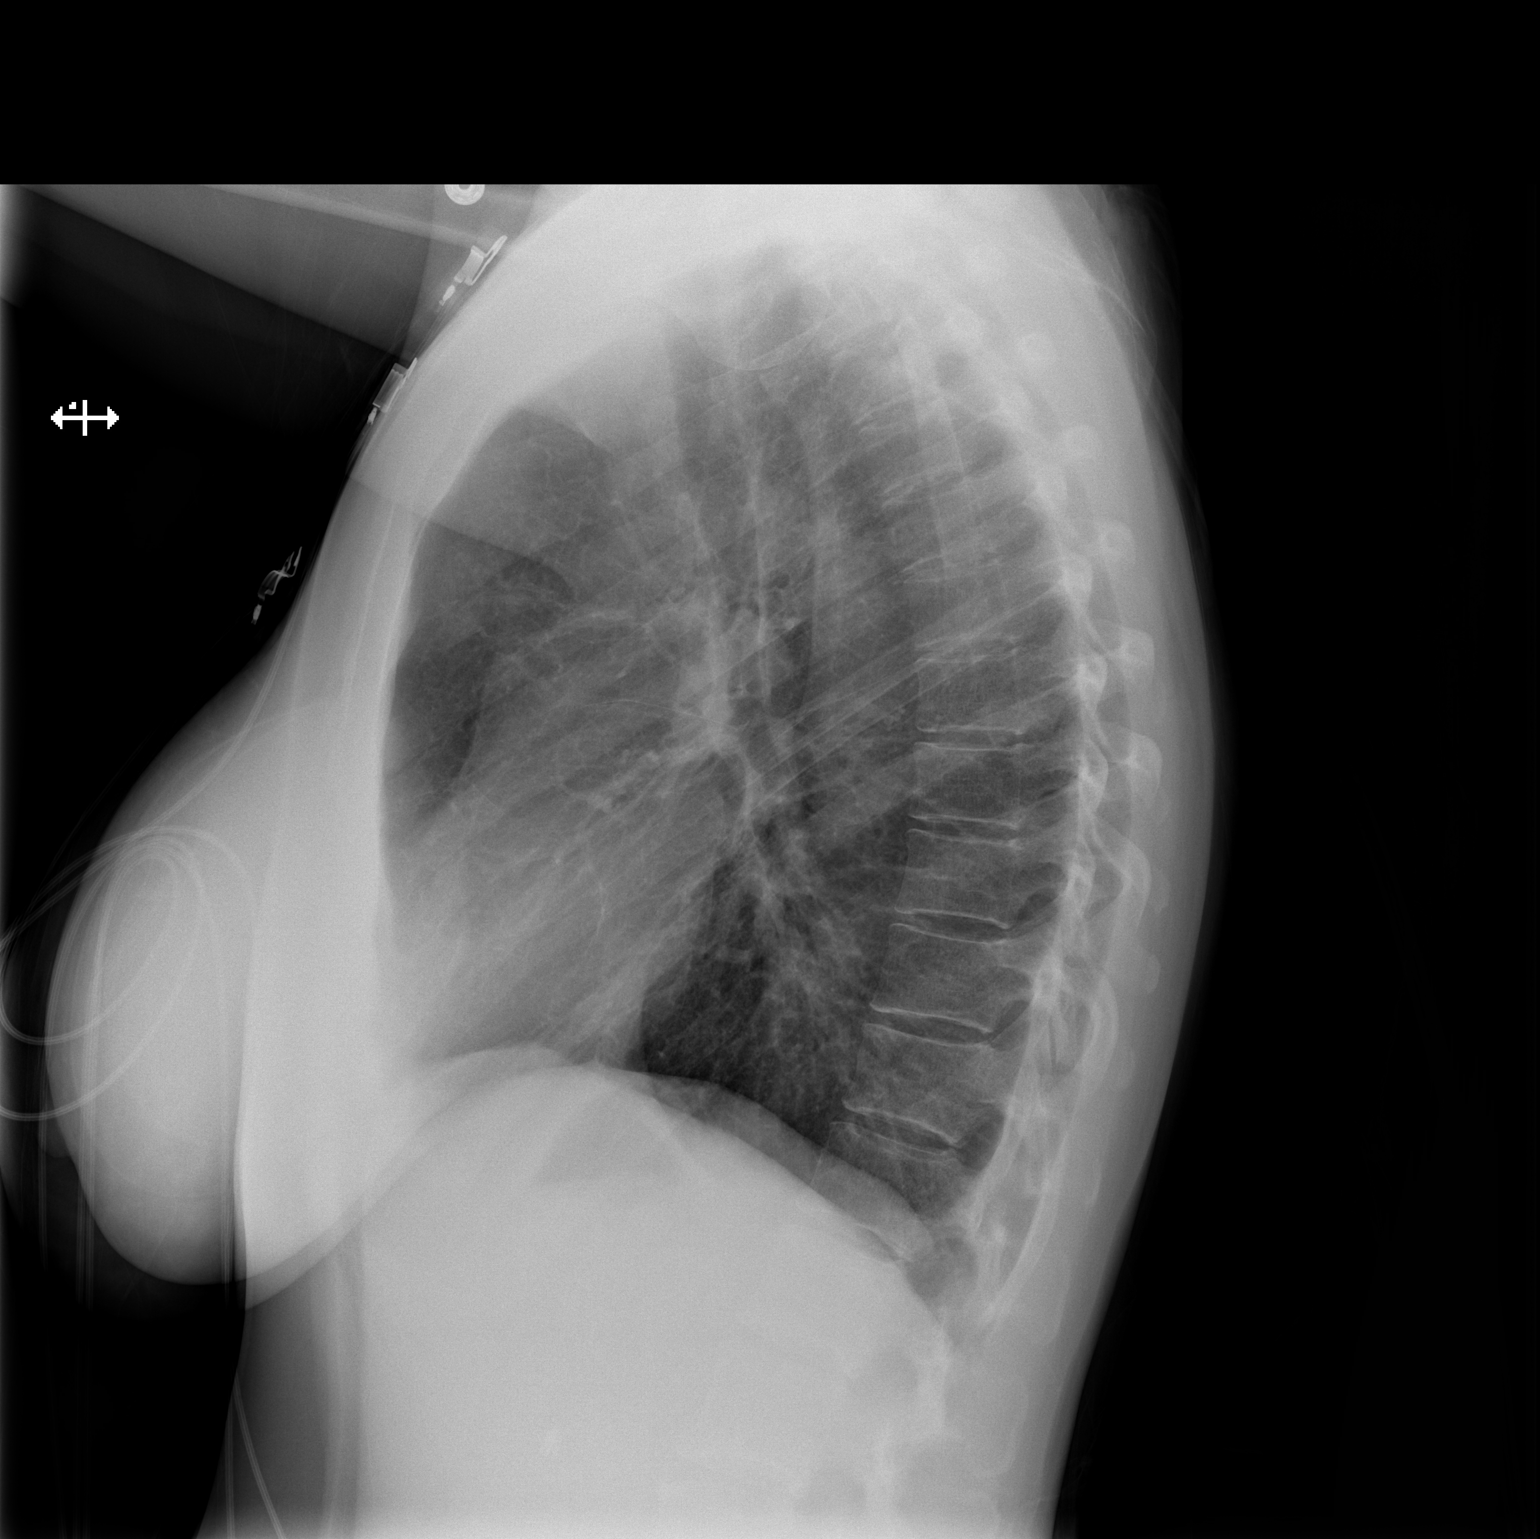

[2 of 2 positions shown; findings below may reference images not displayed]

FINDINGS: The lungs are well-aerated and clear. There is no evidence of focal
opacification, pleural effusion or pneumothorax.

The heart is normal in size; the mediastinal contour is within
normal limits. No acute osseous abnormalities are seen. Clips are
noted within the right upper quadrant, reflecting prior
cholecystectomy.
IMPRESSION: No acute cardiopulmonary process seen.

## 2015-02-20 MED FILL — clonazePAM 0.5 MG TABS: 0.5 | 30 days supply | Qty: 30 | Fill #4

## 2015-03-23 MED FILL — LORazepam 0.5 MG TABS: 0.5 | 30 days supply | Qty: 90 | Fill #0

## 2015-03-23 MED FILL — clonazePAM 0.5 MG TABS: 0.5 | 30 days supply | Qty: 30 | Fill #5

## 2015-04-27 MED FILL — clonazePAM 0.5 MG TABS: 0.5 | 30 days supply | Qty: 30 | Fill #0

## 2015-05-30 MED FILL — clonazePAM 0.5 MG TABS: 0.5 | 30 days supply | Qty: 30 | Fill #1

## 2015-07-03 MED FILL — clonazePAM 0.5 MG TABS: 0.5 | 30 days supply | Qty: 30 | Fill #2

## 2015-07-16 ENCOUNTER — Other Ambulatory Visit: Payer: Self-pay | Admitting: Gynecology

## 2015-07-16 DIAGNOSIS — N644 Mastodynia: Secondary | ICD-10-CM

## 2015-07-23 ENCOUNTER — Ambulatory Visit
Admission: RE | Admit: 2015-07-23 | Discharge: 2015-07-23 | Disposition: A | Discharge status: Home / Self Care | Payer: 59 | Source: Ambulatory Visit | Attending: Gynecology | Admitting: Gynecology

## 2015-07-23 ENCOUNTER — Other Ambulatory Visit: Payer: Self-pay | Admitting: Gynecology

## 2015-07-23 DIAGNOSIS — N644 Mastodynia: Secondary | ICD-10-CM

## 2015-07-30 MED FILL — clonazePAM 0.5 MG TABS: 0.5 | 30 days supply | Qty: 30 | Fill #3

## 2015-08-23 ENCOUNTER — Encounter (HOSPITAL_COMMUNITY): Payer: Self-pay | Admitting: Internal Medicine

## 2015-08-23 ENCOUNTER — Ambulatory Visit (HOSPITAL_COMMUNITY)
Admission: RE | Admit: 2015-08-23 | Discharge: 2015-08-23 | Disposition: A | Discharge status: Home / Self Care | Payer: 59 | Source: Ambulatory Visit | Attending: Internal Medicine | Admitting: Internal Medicine

## 2015-08-23 VITALS — BP 140/86 | HR 89 | Ht 63.0 in | Wt 161.4 lb

## 2015-08-23 DIAGNOSIS — I10 Essential (primary) hypertension: Secondary | ICD-10-CM | POA: Diagnosis not present

## 2015-08-23 DIAGNOSIS — R0789 Other chest pain: Secondary | ICD-10-CM | POA: Diagnosis not present

## 2015-08-23 DIAGNOSIS — I509 Heart failure, unspecified: Secondary | ICD-10-CM | POA: Insufficient documentation

## 2015-08-23 DIAGNOSIS — R0600 Dyspnea, unspecified: Secondary | ICD-10-CM | POA: Insufficient documentation

## 2015-08-23 LAB — BASIC METABOLIC PANEL
Anion gap: 6 (ref 5–15)
CHLORIDE: 106 mmol/L (ref 101–111)
CO2: 25 mmol/L (ref 22–32)
Calcium: 9 mg/dL (ref 8.9–10.3)
Creatinine, Ser: 0.8 mg/dL (ref 0.44–1.00)
GFR calc non Af Amer: >60 mL/min (ref >60)
Glucose, Bld: 112 mg/dL — ABNORMAL HIGH (ref 65–99)
Potassium: 3.8 mmol/L (ref 3.5–5.1)
Sodium: 137 mmol/L (ref 135–145)

## 2015-08-23 LAB — TSH: TSH: 0.408 u[IU]/mL (ref 0.350–4.500)

## 2015-08-23 LAB — CBC
HEMATOCRIT: 42 % (ref 36.0–46.0)
HEMOGLOBIN: 13.5 g/dL (ref 12.0–15.0)
MCH: 28.8 pg (ref 26.0–34.0)
MCHC: 32.1 g/dL (ref 30.0–36.0)
MCV: 89.6 fL (ref 78.0–100.0)
Platelets: 232 10*3/uL (ref 150–400)
RBC: 4.69 MIL/uL (ref 3.87–5.11)
RDW: 13.4 % (ref 11.5–15.5)
WBC: 8.2 10*3/uL (ref 4.0–10.5)

## 2015-08-23 LAB — BRAIN NATRIURETIC PEPTIDE: B NATRIURETIC PEPTIDE 5: 35.9 pg/mL (ref 0.0–100.0)

## 2015-08-23 MED ORDER — FUROSEMIDE 20 MG PO TABS: 20.0000 mg | ORAL_TABLET | Freq: Every day | ORAL | 6 refills | Status: DC

## 2015-08-23 NOTE — Progress Notes (Signed)
REDS VEST READING= 29 CHEST RULER=17.5  VEST FITTING TASKS: POSTURE=standing HEIGHT MARKER=short CENTER STRIP=aligned  COMMENTS: approved by sensible

## 2015-08-23 NOTE — Progress Notes (Signed)
Patient ID: Donna Vazquez, female   DOB: 11/10/1970, 45 y.o.   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  We saw her last in 2014 after she separated from her husband and developed dyspnea. She was seen at [facility]. Had an echo there with normal EF and LVH. BNP 300-400.  We repeated echo here in 11/14 with normal systolic and diastolic parameters. PFTs were normal. Chest CT normal. Improved with Klonopin.  Says she has felt much worse over past 3-4 months. Over that time has had more swelling and exertional dyspnea. Also having multiple episodes of chest pressure. Happens several times a week mostly after she has weight gain and with exertion. Has not taken lasix for months. Has gained about 25 pounds in 1 year. SBP typically 110-120. Restarted her spiro about 2 weeks ago. Taking occasional outpatient lasix. Now perimenopausal.     Objective:    Vital Signs:   Pulse Rate:  [89] 89 (08/03 1440) BP: (140)/(86) 140/86 (08/03 1440) Weight:  [161 lb 6.4 oz (73.2 kg)] 161 lb 6.4 oz (73.2 kg) (08/03 1440)    Weight change: Filed Weights   08/23/15 1440  Weight: 161 lb 6.4 oz (73.2 kg)    Intake/Output:  No intake or output data in the 24 hours ending 08/23/15 1457   Physical Exam: General: NAD. Breathing ok.  HEENT: normal Neck: supple. JVP flat. Carotids 2+ bilat; no bruits. No lymphadenopathy or thryomegaly appreciated. Cor: PMI nondisplaced. Regular rate & rhythm. No rubs, gallops or murmurs. Lungs: clear;  Abdomen: soft, nontender, nondistended. No hepatosplenomegaly. No bruits or masses. Good bowel sounds. Extremities: no cyanosis, clubbing, rash, 1-2+edema Neuro: alert & orientedx3, cranial nerves grossly intact. moves all 4 extremities w/o difficulty. Affect pleasant  ECG: NSR 84 No ST-T wave abnormalities.     Labs: Basic Metabolic Panel: No results for input(s): NA, K, CL, CO2, GLUCOSE, BUN, CREATININE, CALCIUM, MG, PHOS in the last 168 hours.  Liver Function Tests: No results for input(s): AST, ALT, ALKPHOS, BILITOT, PROT, ALBUMIN in the last 168 hours. No results for input(s): LIPASE, AMYLASE in the last 168 hours. No results for input(s): AMMONIA in the last 168 hours.  CBC: No results for input(s): WBC, NEUTROABS, HGB, HCT, MCV, PLT in the last 168 hours.  Cardiac Enzymes: No results for input(s): CKTOTAL, CKMB, CKMBINDEX, TROPONINI in the last 168 hours.  BNP: BNP (last 3 results) No results for input(s): PROBNP in the last 8760 hours.  CBG: No results for input(s): GLUCAP in the last 168 hours.  Coagulation Studies: No results for input(s): LABPROT, INR in the last 72 hours.   Imaging: No results found.   Medications:    Current Outpatient Prescriptions on File Prior to Encounter  Medication Sig Dispense Refill  . clonazePAM (KLONOPIN) 0.5 MG tablet Take 1 tablet (0.5 mg total) by mouth 3 (three) times daily. (Patient taking differently: Take 0.5 mg by mouth at bedtime. ) 90 tablet 0  . spironolactone (ALDACTONE) 25 MG tablet Take 25 mg by mouth 2 (two) times daily.     No current facility-administered medications on file prior to encounter.     Assessment/Plan:   1) Recurrent dyspnea 2) HTN 3) Chest pressure  No obvious pathology on exam but symptoms concerning.   Will check:  Echo Labs: BMET, BNP, CBC, TSH Lasix 20 mg daily Repeat BMET 1 week VEST  (lung water low today) Keep BP log  If no improvement in 2-3 weeks will discuss further options  Bensimhon, Daniel,MD 2:57 PM

## 2015-08-23 NOTE — Patient Instructions (Signed)
Start Furosemide (Lasix) 20 mg daily  Labs today  Labs in 1 week  Your physician has requested that you have an echocardiogram. Echocardiography is a painless test that uses sound waves to create images of your heart. It provides your doctor with information about the size and shape of your heart and how well your heart's chambers and valves are working. This procedure takes approximately one hour. There are no restrictions for this procedure.  Your physician has requested that you regularly monitor and record your blood pressure readings at home. Please use the same machine at the same time of day to check your readings and record them to bring to your follow-up visit.  Your physician recommends that you schedule a follow-up appointment in: 2-3 weeks

## 2015-08-30 ENCOUNTER — Ambulatory Visit (HOSPITAL_BASED_OUTPATIENT_CLINIC_OR_DEPARTMENT_OTHER)
Admission: RE | Admit: 2015-08-30 | Discharge: 2015-08-30 | Disposition: A | Discharge status: Home / Self Care | Payer: 59 | Source: Ambulatory Visit | Attending: Internal Medicine | Admitting: Internal Medicine

## 2015-08-30 ENCOUNTER — Ambulatory Visit (HOSPITAL_COMMUNITY)
Admission: RE | Admit: 2015-08-30 | Discharge: 2015-08-30 | Disposition: A | Discharge status: Home / Self Care | Payer: 59 | Source: Ambulatory Visit | Attending: Internal Medicine | Admitting: Internal Medicine

## 2015-08-30 DIAGNOSIS — R0600 Dyspnea, unspecified: Secondary | ICD-10-CM

## 2015-08-30 DIAGNOSIS — I10 Essential (primary) hypertension: Secondary | ICD-10-CM | POA: Diagnosis not present

## 2015-08-30 DIAGNOSIS — I5023 Acute on chronic systolic (congestive) heart failure: Secondary | ICD-10-CM | POA: Diagnosis not present

## 2015-08-30 LAB — BASIC METABOLIC PANEL
ANION GAP: 7 (ref 5–15)
BUN: 10 mg/dL (ref 6–20)
CALCIUM: 9.4 mg/dL (ref 8.9–10.3)
CO2: 27 mmol/L (ref 22–32)
Chloride: 100 mmol/L — ABNORMAL LOW (ref 101–111)
Creatinine, Ser: 0.81 mg/dL (ref 0.44–1.00)
Glucose, Bld: 97 mg/dL (ref 65–99)
Potassium: 3.8 mmol/L (ref 3.5–5.1)
SODIUM: 134 mmol/L — AB (ref 135–145)

## 2015-08-30 NOTE — Progress Notes (Signed)
  Echocardiogram 2D Echocardiogram has been performed.  Arvil ChacoFoster, Jayma Volpi 08/30/2015, 4:04 PM

## 2015-09-03 MED FILL — clonazePAM 0.5 MG TABS: 0.5 | 30 days supply | Qty: 30 | Fill #4

## 2015-09-13 ENCOUNTER — Encounter (HOSPITAL_COMMUNITY): Payer: Self-pay | Admitting: Internal Medicine

## 2015-09-13 ENCOUNTER — Ambulatory Visit (HOSPITAL_COMMUNITY)
Admission: RE | Admit: 2015-09-13 | Discharge: 2015-09-13 | Disposition: A | Discharge status: Home / Self Care | Payer: 59 | Source: Ambulatory Visit | Attending: Internal Medicine | Admitting: Internal Medicine

## 2015-09-13 VITALS — BP 120/86 | HR 74 | Wt 155.8 lb

## 2015-09-13 DIAGNOSIS — R600 Localized edema: Secondary | ICD-10-CM

## 2015-09-13 DIAGNOSIS — I10 Essential (primary) hypertension: Secondary | ICD-10-CM | POA: Diagnosis not present

## 2015-09-13 DIAGNOSIS — Z79899 Other long term (current) drug therapy: Secondary | ICD-10-CM | POA: Diagnosis not present

## 2015-09-13 DIAGNOSIS — R0600 Dyspnea, unspecified: Secondary | ICD-10-CM

## 2015-09-13 MED ORDER — FUROSEMIDE 20 MG PO TABS: 20.0000 mg | ORAL_TABLET | ORAL | 6 refills | Status: DC | PRN

## 2015-09-13 NOTE — Addendum Note (Signed)
Encounter addended by: Noralee SpaceHeather M Dolores Mcgovern, RN on: 09/13/2015  3:21 PM<BR>    Actions taken: Order Entry activity accessed, Sign clinical note

## 2015-09-13 NOTE — Patient Instructions (Signed)
Furosemide only as needed  Your physician recommends that you schedule a follow-up appointment in: AS NEEDED

## 2015-09-13 NOTE — Progress Notes (Signed)
Patient ID: Donna Vazquez, female   DOB: 11/18/1970, 45 y.o.   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  We saw her last in 2014 after she separated from her husband and developed dyspnea. [facility] was [facility]. Had an echo there with normal EF and LVH. BNP 300-400.  We repeated echo here in 11/14 with normal systolic and diastolic parameters. PFTs were normal. Chest CT normal. Improved with Klonopin.  We saw her about a month ago with worsening swelling and exertional dyspnea. Also having multiple episodes of chest pressure. Volume status looked ok on exam. Labs looked good including BNP 35.  Doing well. Running 3 miles per day on TM at 5mph. Watching her weight and taking a diet pill but still not losing as much weight as she wants. Weight down down 6 pounds. Taking lasix every day. No edema.    BP log:  SBP mostly 120-140  Echo 08/30/15: EF 60-65%. RV normal. Normal diastolic function. No PAH.    Objective:    Vital Signs:   Pulse Rate:  [74] 74 (08/24 1441) BP: (120)/(86) 120/86 (08/24 1441) SpO2:  [98 %] 98 % (08/24 1441) Weight:  [155 lb 12.8 oz (70.7 kg)] 155 lb 12.8 oz (70.7 kg) (08/24 1441)    Weight change: Filed Weights   09/13/15 1441  Weight: 155 lb 12.8 oz (70.7 kg)    Intake/Output:  No intake or output data in the 24 hours ending 09/13/15 1502   Physical Exam: General: NAD. Breathing ok.  HEENT: normal Neck: supple. JVP flat. Carotids 2+ bilat; no bruits. No lymphadenopathy or thryomegaly appreciated. Cor: PMI nondisplaced. Regular rate & rhythm. No rubs, gallops or murmurs. Lungs: clear;  Abdomen: soft, nontender, nondistended. No hepatosplenomegaly. No bruits or masses. Good bowel sounds. Extremities: no cyanosis, clubbing, rash, trace edema Neuro: alert & orientedx3, cranial nerves grossly  intact. moves all 4 extremities w/o difficulty. Affect pleasant  ECG: NSR 84 No ST-T wave abnormalities.    Labs: Basic Metabolic Panel: No results for input(s): NA, K, CL, CO2, GLUCOSE, BUN, CREATININE, CALCIUM, MG, PHOS in the last 168 hours.  Liver Function Tests: No results for input(s): AST, ALT, ALKPHOS, BILITOT, PROT, ALBUMIN in the last 168 hours. No results for input(s): LIPASE, AMYLASE in the last 168 hours. No results for input(s): AMMONIA in the last 168 hours.  CBC: No results for input(s): WBC, NEUTROABS, HGB, HCT, MCV, PLT in the last 168 hours.  Cardiac Enzymes: No results for input(s): CKTOTAL, CKMB, CKMBINDEX, TROPONINI in the last 168 hours.  BNP: BNP (last 3 results) No results for input(s): PROBNP in the last 8760 hours.  CBG: No results for input(s): GLUCAP in the last 168 hours.  Coagulation Studies: No results for input(s): LABPROT, INR in the last 72 hours.   Imaging: No results found.   Medications:    Current Outpatient Prescriptions on File Prior to Encounter  Medication Sig Dispense Refill  . furosemide (LASIX) 20 MG tablet Take 1 tablet (20 mg total) by mouth daily. 30 tablet 6  . Melatonin 10 MG CAPS Take 1 capsule by mouth at bedtime.    Marland Kitchen. spironolactone (ALDACTONE) 25 MG tablet Take 25 mg by mouth 2 (two) times daily.     No current facility-administered medications on  file prior to encounter.     Assessment:   1) Recurrent dyspnea, resolved 2) HTN 3) LE edema  Plan/Discussion:    Doing very well. BP under control. Volume status and BNP look good. Echo with normal systolic and diastolic function.   Would reserve lasix for prn use only. AI think trial of weight loss drug is likely safe but I prefer low-carb diets for weight loss.    Sophia Cubero,MD 3:02 PM

## 2015-09-19 NOTE — Addendum Note (Signed)
Encounter addended by: Dolores Pattyaniel R Bensimhon, MD on: 09/19/2015 11:50 PM<BR>    Actions taken: Visit diagnoses modified, LOS modified, Sign clinical note

## 2015-10-04 MED FILL — clonazePAM 0.5 MG TABS: 0.5 | 30 days supply | Qty: 30 | Fill #5

## 2015-12-27 ENCOUNTER — Telehealth (HOSPITAL_COMMUNITY): Payer: Self-pay | Admitting: *Deleted

## 2015-12-27 DIAGNOSIS — R079 Chest pain, unspecified: Secondary | ICD-10-CM

## 2015-12-27 NOTE — Telephone Encounter (Signed)
Pt called concerned about some CP.  She states she has been having a lot of GI issues with terrible burning/acid reflux and has been seeing GI.  She states she had an endoscopy yesterday and was told it was normal and to f/u with cards.  She states she has lost a lot of weight without trying and feels horrible, no energy, cp and back pain.  She is on protonix bid but states it is not helping.  She also c/o sob w/exertion, such as walking to mailbox and will get edema in ft/ankles a few times a week, she will take lasix and this gets better.  Chest pain is described as tightness, she states it is intermittent, denies pain in jaw/face/arms.  Will send to Dr Gala RomneyBensimhon for review, advised if pain worse to report to ER, pt agreeable.

## 2015-12-29 NOTE — Telephone Encounter (Signed)
It sounds mostly like anxiety/depression. Let's order stress echo to make sure nothing cardiac going on.

## 2015-12-31 NOTE — Telephone Encounter (Signed)
Left message to call back  

## 2016-01-01 ENCOUNTER — Other Ambulatory Visit (HOSPITAL_COMMUNITY): Payer: Self-pay | Admitting: Cardiology

## 2016-01-01 NOTE — Addendum Note (Signed)
Addended by: JEFFRIES, Milagros ReapHANTEL M on: 01/01/2016 11:41 AM   Modules accepted: Orders

## 2016-01-02 ENCOUNTER — Telehealth (HOSPITAL_COMMUNITY): Payer: Self-pay | Admitting: *Deleted

## 2016-01-02 NOTE — Telephone Encounter (Signed)
Patient given detailed instructions per Stress Test Requisition Sheet for test on 03/06/15 at 7:30.Patient Notified to arrive 30 minutes early, and that it is imperative to arrive on time for appointment to keep from having the test rescheduled.  Patient verbalized understanding. Daneil DolinSharon S Brooks

## 2016-01-03 ENCOUNTER — Ambulatory Visit (HOSPITAL_BASED_OUTPATIENT_CLINIC_OR_DEPARTMENT_OTHER): Payer: 59

## 2016-01-03 ENCOUNTER — Ambulatory Visit (HOSPITAL_COMMUNITY): Payer: 59 | Attending: Cardiology

## 2016-01-03 ENCOUNTER — Other Ambulatory Visit: Payer: Self-pay

## 2016-01-03 DIAGNOSIS — R079 Chest pain, unspecified: Secondary | ICD-10-CM | POA: Diagnosis not present

## 2016-01-07 ENCOUNTER — Other Ambulatory Visit (HOSPITAL_COMMUNITY): Payer: 59

## 2016-08-12 ENCOUNTER — Telehealth: Payer: Self-pay | Admitting: Gastroenterology

## 2016-08-18 ENCOUNTER — Encounter: Payer: Self-pay | Admitting: Gastroenterology

## 2016-08-18 NOTE — Telephone Encounter (Signed)
Dr.Armbruster reviewed records and accepted for patient to be scheduled for an office visit. Left message for patient to schedule an office visit.

## 2016-09-16 NOTE — Telephone Encounter (Signed)
Made On: Change Notes: Change Notes: Canceled: 08/18/2016 11:23 AM 08/18/2016 11:24 AM 08/20/2016 2:27 PM 08/26/2016 1:10 PM By: By: By: By: Henri Medal, STEPHANIE S HILL, ASHLEY N STEVENS, STEPHANIE S  Cancel Rsn: Patient (seen elsewhere sooner)

## 2016-10-21 ENCOUNTER — Ambulatory Visit: Payer: 59 | Admitting: Gastroenterology

## 2016-12-08 ENCOUNTER — Encounter (HOSPITAL_BASED_OUTPATIENT_CLINIC_OR_DEPARTMENT_OTHER): Admission: RE | Payer: Self-pay | Source: Ambulatory Visit

## 2016-12-08 ENCOUNTER — Ambulatory Visit (HOSPITAL_BASED_OUTPATIENT_CLINIC_OR_DEPARTMENT_OTHER): Admission: RE | Admit: 2016-12-08 | Payer: 59 | Source: Ambulatory Visit | Admitting: Obstetrics and Gynecology

## 2016-12-08 SURGERY — TRANSVAGINAL TAPE (TVT) PROCEDURE
Anesthesia: General

## 2017-01-27 ENCOUNTER — Other Ambulatory Visit (HOSPITAL_COMMUNITY): Payer: Self-pay | Admitting: Internal Medicine

## 2017-08-05 ENCOUNTER — Ambulatory Visit: Payer: 59 | Admitting: Family Medicine

## 2021-01-17 ENCOUNTER — Other Ambulatory Visit: Payer: Self-pay | Admitting: Obstetrics and Gynecology

## 2021-01-17 DIAGNOSIS — R928 Other abnormal and inconclusive findings on diagnostic imaging of breast: Secondary | ICD-10-CM
